# Patient Record
Sex: Male | Born: 1970 | Race: White | Hispanic: No | Marital: Married | Smoking: Former smoker
Health system: Southern US, Community
[De-identification: ages and names within clinical notes are randomized; demographics above are authoritative.]

## PROBLEM LIST (undated history)

## (undated) DIAGNOSIS — E119 Type 2 diabetes mellitus without complications: Secondary | ICD-10-CM

## (undated) HISTORY — PX: KNEE SURGERY: SHX244

---

## 2006-05-20 ENCOUNTER — Ambulatory Visit (HOSPITAL_COMMUNITY): Admission: RE | Admit: 2006-05-20 | Discharge: 2006-05-20 | Payer: Self-pay | Admitting: *Deleted

## 2007-08-09 ENCOUNTER — Emergency Department (HOSPITAL_COMMUNITY): Admission: EM | Admit: 2007-08-09 | Discharge: 2007-08-09 | Payer: Self-pay | Admitting: Emergency Medicine

## 2011-07-13 ENCOUNTER — Emergency Department (HOSPITAL_COMMUNITY)
Admission: EM | Admit: 2011-07-13 | Discharge: 2011-07-13 | Disposition: A | Discharge status: Home / Self Care | Payer: Self-pay | Attending: Emergency Medicine | Admitting: Emergency Medicine

## 2011-07-13 ENCOUNTER — Emergency Department (HOSPITAL_COMMUNITY): Payer: Self-pay

## 2011-07-13 ENCOUNTER — Encounter: Payer: Self-pay | Admitting: *Deleted

## 2011-07-13 DIAGNOSIS — M7580 Other shoulder lesions, unspecified shoulder: Secondary | ICD-10-CM

## 2011-07-13 DIAGNOSIS — M436 Torticollis: Secondary | ICD-10-CM

## 2011-07-13 DIAGNOSIS — M898X9 Other specified disorders of bone, unspecified site: Secondary | ICD-10-CM | POA: Insufficient documentation

## 2011-07-13 DIAGNOSIS — Z87891 Personal history of nicotine dependence: Secondary | ICD-10-CM | POA: Insufficient documentation

## 2011-07-13 MED ORDER — CYCLOBENZAPRINE HCL 10 MG PO TABS: 10.0000 mg | ORAL_TABLET | Freq: Three times a day (TID) | ORAL | Status: AC | PRN

## 2011-07-13 MED ORDER — HYDROMORPHONE HCL 1 MG/ML IJ SOLN
1.0000 mg | Freq: Once | INTRAMUSCULAR | Status: DC
  Filled 2011-07-13: qty 1

## 2011-07-13 MED ORDER — OXYCODONE-ACETAMINOPHEN 5-325 MG PO TABS: 1.0000 | ORAL_TABLET | ORAL | Status: AC | PRN

## 2011-07-13 MED ORDER — ONDANSETRON 8 MG PO TBDP
8.0000 mg | ORAL_TABLET | Freq: Once | ORAL | Status: DC
Start: 2011-07-13 — End: 2011-07-14
  Filled 2011-07-13: qty 1

## 2011-07-13 MED ORDER — CYCLOBENZAPRINE HCL 10 MG PO TABS
10.0000 mg | ORAL_TABLET | Freq: Once | ORAL | Status: AC
  Administered 2011-07-13: 10 mg via ORAL
  Filled 2011-07-13: qty 1

## 2011-07-13 MED ORDER — KETOROLAC TROMETHAMINE 60 MG/2ML IM SOLN
60.0000 mg | Freq: Once | INTRAMUSCULAR | Status: AC
  Administered 2011-07-13: 60 mg via INTRAMUSCULAR
  Filled 2011-07-13: qty 2

## 2011-07-13 NOTE — ED Notes (Signed)
Left shoulder pain that radiates up into neck area, pain increases with movement, denies any injury, states that he woke up with the pain a few days ago

## 2011-07-13 NOTE — ED Provider Notes (Signed)
History     CSN: [id number] Arrival date & time: 07/13/2011  8:30 PM  Chief Complaint  Patient presents with  . Torticollis   HPI Comments: Patient c/o pain to his left neck and shoulder for several days.  States the pain began after sleeping with his "head and neck propped up on pillows" at a hotel.  Describes the pain as sharp and aching from his left neck down his trapezius and into the left shoulder.  He denies chest pain, neck stiffness, visual changes, shortness of breath, numbness or weakness of the arm.    Patient is a 40 y.o. male presenting with neck injury. The history is provided by the patient.  Neck Injury This is a new problem. The current episode started in the past 7 days. The problem occurs constantly. The problem has been gradually worsening. Associated symptoms include myalgias and neck pain. Pertinent negatives include no abdominal pain, arthralgias, chest pain, chills, coughing, diaphoresis, fever, headaches, joint swelling, numbness, sore throat, swollen glands, vomiting or weakness. The symptoms are aggravated by bending (movement). He has tried nothing for the symptoms. The treatment provided no relief.    History reviewed. No pertinent past medical history.  Past Surgical History  Procedure Date  . Knee surgery     14 total surgeries on left knee    History reviewed. No pertinent family history.  History  Substance Use Topics  . Smoking status: Former Games developer  . Smokeless tobacco: Not on file  . Alcohol Use: No      Review of Systems  Constitutional: Negative for fever, chills and diaphoresis.  HENT: Positive for neck pain. Negative for ear pain, sore throat, facial swelling, trouble swallowing and neck stiffness.   Eyes: Negative for pain.  Respiratory: Negative for cough, chest tightness and shortness of breath.   Cardiovascular: Negative for chest pain.  Gastrointestinal: Negative for vomiting and abdominal pain.  Musculoskeletal: Positive for  myalgias. Negative for back pain, joint swelling and arthralgias.  Skin: Negative.   Neurological: Negative for dizziness, facial asymmetry, speech difficulty, weakness, numbness and headaches.  Hematological: Does not bruise/bleed easily.  Psychiatric/Behavioral: Negative for confusion.    Physical Exam  BP 149/86  Pulse 77  Temp(Src) 98.1 F (36.7 C) (Oral)  Resp 20  Ht 5\' 10"  (1.778 m)  Wt 250 lb (113.399 kg)  BMI 35.87 kg/m2  SpO2 97%  Physical Exam  Nursing note and vitals reviewed. Constitutional: He is oriented to person, place, and time. He appears well-developed and well-nourished. No distress.  HENT:  Head: Normocephalic and atraumatic.  Right Ear: External ear normal.  Left Ear: External ear normal.  Mouth/Throat: Oropharynx is clear and moist.  Eyes: EOM are normal. Pupils are equal, round, and reactive to light.  Neck: Normal range of motion. Neck supple. No JVD present. Muscular tenderness present. No spinous process tenderness present. No rigidity. No edema, no erythema and normal range of motion present. No Brudzinski's sign and no Kernig's sign noted. No thyromegaly present.    Cardiovascular: Normal rate, regular rhythm and normal heart sounds.   Pulmonary/Chest: Effort normal and breath sounds normal.  Musculoskeletal: He exhibits tenderness. He exhibits no edema.  Lymphadenopathy:    He has no cervical adenopathy.  Neurological: He is alert and oriented to person, place, and time. No cranial nerve deficit. He exhibits normal muscle tone. Coordination normal.  Skin: Skin is warm and dry.  Psychiatric: He has a normal mood and affect.    ED Course  Procedures  MDM  Patient is ambulatory, feels better.  Pain to left shoulder at the [facility] joint, trapezius and left cervical paraspinal muscles that is reproduced with movement and palpation.  No chest pain, dyspnea, hypoxia, meningeal signs or focal numbness or weakness.  Pain began after sleeping "propped up"  on pillows at a hotel.  Likely torticollis.  I have reviewed the x-ray results and discussed the findings with the pt.  He agrees to f/u with ortho  Dg Shoulder Left  07/13/2011  *RADIOLOGY REPORT*  Clinical Data: Left shoulder pain.  LEFT SHOULDER - 2+ VIEW  Comparison: None.  Findings: No evidence of fracture or dislocation.  Mild degenerative spurring is seen involving the acromioclavicular joint.  No other significant bone abnormality identified.  Soft tissues are unremarkable.  IMPRESSION:  1.  No acute findings. 2.  Mild acromioclavicular degenerative spurring.  Original Report Authenticated By: Danae Orleans, M.D.       Luc Shammas L. [place], [place] 07/15/11 1433

## 2011-08-21 NOTE — ED Provider Notes (Signed)
Medical screening examination/treatment/procedure(s) were performed by non-physician practitioner and as supervising physician I was immediately available for consultation/collaboration.  Doug Sou, MD 08/21/11 [phone]

## 2011-09-23 LAB — POCT CARDIAC MARKERS
Myoglobin, poc: 27.1
Operator id: 217071
Troponin i, poc: <0.05

## 2011-09-23 LAB — BASIC METABOLIC PANEL
BUN: 11
CO2: 30
GFR calc Af Amer: >60
GFR calc non Af Amer: >60
Sodium: 137

## 2011-09-23 LAB — DIFFERENTIAL
Basophils Absolute: 0.1
Monocytes Absolute: 0.7
Neutro Abs: 7
Neutrophils Relative %: 69

## 2011-09-23 LAB — CBC: WBC: 10.2

## 2015-03-18 ENCOUNTER — Emergency Department (HOSPITAL_COMMUNITY)
Admission: EM | Admit: 2015-03-18 | Discharge: 2015-03-18 | Disposition: A | Discharge status: Home / Self Care | Payer: Medicaid Other | Attending: Emergency Medicine | Admitting: Emergency Medicine

## 2015-03-18 ENCOUNTER — Emergency Department (HOSPITAL_COMMUNITY): Payer: Medicaid Other

## 2015-03-18 ENCOUNTER — Encounter (HOSPITAL_COMMUNITY): Payer: Self-pay | Admitting: Emergency Medicine

## 2015-03-18 DIAGNOSIS — M79672 Pain in left foot: Secondary | ICD-10-CM

## 2015-03-18 DIAGNOSIS — G8929 Other chronic pain: Secondary | ICD-10-CM | POA: Diagnosis not present

## 2015-03-18 DIAGNOSIS — Z87891 Personal history of nicotine dependence: Secondary | ICD-10-CM | POA: Insufficient documentation

## 2015-03-18 DIAGNOSIS — Z791 Long term (current) use of non-steroidal anti-inflammatories (NSAID): Secondary | ICD-10-CM | POA: Insufficient documentation

## 2015-03-18 DIAGNOSIS — E119 Type 2 diabetes mellitus without complications: Secondary | ICD-10-CM | POA: Insufficient documentation

## 2015-03-18 HISTORY — DX: Type 2 diabetes mellitus without complications: E11.9

## 2015-03-18 MED ORDER — DICLOFENAC SODIUM 75 MG PO TBEC: 75.0000 mg | DELAYED_RELEASE_TABLET | Freq: Two times a day (BID) | ORAL | Status: DC
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
## 2015-03-18 NOTE — ED Notes (Signed)
Pt c/o increased left foot pain over last month and he denies any injury.
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
## 2015-03-18 NOTE — ED Notes (Signed)
Pt alert & oriented x4, stable gait. Patient given discharge instructions, paperwork & prescription(s). Patient  instructed to stop at the registration desk to finish any additional paperwork. Patient verbalized understanding. Pt left department w/ no further questions. 

## 2015-10-12 ENCOUNTER — Emergency Department (HOSPITAL_COMMUNITY)
Admission: EM | Admit: 2015-10-12 | Discharge: 2015-10-12 | Disposition: A | Discharge status: Home / Self Care | Payer: Medicaid Other | Attending: Emergency Medicine | Admitting: Emergency Medicine

## 2015-10-12 ENCOUNTER — Encounter (HOSPITAL_COMMUNITY): Payer: Self-pay | Admitting: Emergency Medicine

## 2015-10-12 ENCOUNTER — Emergency Department (HOSPITAL_COMMUNITY): Payer: Medicaid Other

## 2015-10-12 DIAGNOSIS — M25561 Pain in right knee: Secondary | ICD-10-CM | POA: Diagnosis present

## 2015-10-12 DIAGNOSIS — M222X1 Patellofemoral disorders, right knee: Secondary | ICD-10-CM | POA: Insufficient documentation

## 2015-10-12 DIAGNOSIS — Z87891 Personal history of nicotine dependence: Secondary | ICD-10-CM | POA: Diagnosis not present

## 2015-10-12 DIAGNOSIS — M25551 Pain in right hip: Secondary | ICD-10-CM | POA: Diagnosis not present

## 2015-10-12 DIAGNOSIS — Z87890 Personal history of sex reassignment: Secondary | ICD-10-CM | POA: Diagnosis not present

## 2015-10-12 DIAGNOSIS — M79604 Pain in right leg: Secondary | ICD-10-CM

## 2015-10-12 DIAGNOSIS — E119 Type 2 diabetes mellitus without complications: Secondary | ICD-10-CM | POA: Diagnosis not present

## 2015-10-12 DIAGNOSIS — M1711 Unilateral primary osteoarthritis, right knee: Secondary | ICD-10-CM

## 2015-10-12 NOTE — ED Provider Notes (Signed)
CSN: [id number]     Arrival date & time 10/12/15  1106 History  By signing my name below, I, Gwenyth Ober, attest that this documentation has been prepared under the direction and in the presence of Ivery Quale, PA-C.  Electronically Signed: Gwenyth Ober, ED Scribe. 10/12/2015. 1:05 PM.   Chief Complaint  Patient presents with  . Leg Pain    right   Patient is a 44 y.o. male presenting with leg pain. The history is provided by the patient. No language interpreter was used.  Leg Pain Location:  Knee and leg Time since incident:  10 days Injury: no   Leg location:  R lower leg Knee location:  R knee Pain details:    Quality:  Aching   Radiates to:  Does not radiate   Severity:  Moderate   Onset quality:  Gradual   Duration:  10 days   Timing:  Intermittent Chronicity:  New Dislocation: no   Foreign body present:  No foreign bodies Exacerbated by: lying down. Associated symptoms: no fever     HPI Comments: Ryan Schwartz is a 44 y.o. 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 He states baseline right hip pain which is unchanged today. Pt denies any injuries or falls. Pt works as a Emergency planning/management officer of homes. He denies fever and chills.  Past Medical History  Diagnosis Date  . Diabetes mellitus without complication [facility])    Past Surgical History  Procedure Laterality Date  . Knee surgery      14 total surgeries on left knee   History reviewed. No pertinent family history. Social History  Substance Use Topics  . Smoking status: Former Games developer  . Smokeless tobacco: None  . Alcohol Use: No   Review of Systems  Constitutional: Negative for fever and chills.  Musculoskeletal: Positive for arthralgias.  All other systems reviewed and are negative.  Allergies  Review of patient's allergies indicates no known  allergies.  Home Medications   Prior to Admission medications   Medication Sig Start Date End Date Taking? Authorizing Provider  ibuprofen (ADVIL,MOTRIN) 200 MG tablet Take 400 mg by mouth every 6 (six) hours as needed. For pain/headaches    Yes Historical Provider, MD   BP 127/75 mmHg  Pulse 68  Temp(Src) 98.2 F (36.8 C) (Oral)  Resp 18  Ht  (1.778 m)  Wt 240 lb (108.863 kg)  BMI 34.44 kg/m2  SpO2 99% Physical Exam  Constitutional: He is oriented to person, place, and time. He appears well-developed and well-nourished. No distress.  HENT:  Head: Normocephalic and atraumatic.  Eyes: Conjunctivae and EOM are normal.  Neck: Neck supple. No tracheal deviation present.  Cardiovascular: Normal rate.   Pulmonary/Chest: Effort normal. No respiratory distress.  Musculoskeletal:  No deformity of the quadriceps area Patella is midline No deformity of the anterior tibial tuberosity Soreness of the upper anterior tibial area No calf swelling and negative Homan's Achilles tendon intact  Neurological: He is alert and oriented to person, place, and time.  No gross motor or sensory deficits appreciated   Skin: Skin is warm and dry.  Psychiatric: He has a normal mood and affect. His behavior is normal.  Nursing note and vitals reviewed.   ED Course  Procedures  DIAGNOSTIC STUDIES: Oxygen Saturation is 99% on RA, normal by my interpretation.  COORDINATION OF CARE: 1:05 PM Discussed x-ray results with evidence for arthritic changes. R/o nerve/circulatory problems and DVT based on exam findings. Discussed treatment plan with pt which includes NSAIDs and follow-up with orthopedist. Knee immobilizer was offered, but pt states he has one at home. Pt agreed to plan.  Labs Review Labs Reviewed - No data to display  Imaging Review Dg Knee Complete 4 Views Right  10/12/2015  CLINICAL DATA:  Posterior right knee pain radiating into calf. EXAM: RIGHT KNEE - COMPLETE 4+ VIEW  COMPARISON:  None. FINDINGS: There are mild proliferative changes involving the tibial spines without significant medial or lateral joint space narrowing. The patellofemoral joint shows moderate proliferative changes. No fracture, dislocation or joint effusion identified. No bony lesions are seen. Soft tissues are unremarkable. IMPRESSION: Moderate patellofemoral proliferative changes.  No acute findings. Electronically Signed   By: Irish LackGlenn  Yamagata M.D.   On: 10/12/2015 12:51   Dg Hip Unilat With Pelvis 2-3 Views Right  10/12/2015  CLINICAL DATA:  Posterior right hip pain. EXAM: DG HIP (WITH OR WITHOUT PELVIS) 2-3V RIGHT COMPARISON:  None. FINDINGS: No fracture or dislocation identified. Minimal proliferative changes are seen involving the acetabulum without significant joint space loss. No bony lesions or destruction. Soft tissues are unremarkable. The visualized bony pelvis is intact and shows no abnormalities. IMPRESSION: No acute findings. Minimal proliferative changes involving the right acetabulum. Electronically Signed   By: Irish LackGlenn  Yamagata M.D.   On: 10/12/2015 12:50   I have personally reviewed and evaluated these images as part of my medical decision-making.   EKG Interpretation None      MDM  Vital signs are well within normal limits. The x-ray shows degenerative changes with proliferative changes in both the right hip and right knee. The patient is referred to Dr. Victorino DikeHewitt for orthopedic evaluation and management of this issue. Knee immobilizer was offered, patient states he has one at home. No evidence of septic joint, or dislocation, or deep vein thrombus.    Final diagnoses:  None    **I have reviewed nursing notes, vital signs, and all appropriate lab and imaging results for this patient.*  **I personally performed the services described in this documentation, which was scribed in my presence. The recorded information has been reviewed and is accurate.Ivery Quale*   Ryan Laine,  PA-C 10/12/15 1342  Samuel JesterKathleen McManus, DO 10/14/15 [phone]

## 2015-10-12 NOTE — ED Notes (Signed)
Pain to right leg for last 10 days, rates pain 8/10.  Denies injury.

## 2015-10-12 NOTE — Discharge Instructions (Signed)
The x-ray of your hip show some arthritis present. The x-ray of your knee show some patellofemoral arthritis and erosion. It is believed that this may be contributing to the discomfort you're having behind your knee and extending anterior calf. Please see Dr. Victorino DikeHewitt, or the orthopedic specialist of your choice for orthopedic evaluation of these findings. Please use your knee immobilizer when up and about.

## 2016-05-02 ENCOUNTER — Emergency Department (HOSPITAL_COMMUNITY)
Admission: EM | Admit: 2016-05-02 | Discharge: 2016-05-02 | Disposition: A | Discharge status: Home / Self Care | Payer: Medicaid Other | Attending: Emergency Medicine | Admitting: Emergency Medicine

## 2016-05-02 ENCOUNTER — Encounter (HOSPITAL_COMMUNITY): Payer: Self-pay | Admitting: Emergency Medicine

## 2016-05-02 DIAGNOSIS — E119 Type 2 diabetes mellitus without complications: Secondary | ICD-10-CM | POA: Diagnosis not present

## 2016-05-02 DIAGNOSIS — L237 Allergic contact dermatitis due to plants, except food: Secondary | ICD-10-CM | POA: Insufficient documentation

## 2016-05-02 DIAGNOSIS — Z87891 Personal history of nicotine dependence: Secondary | ICD-10-CM | POA: Insufficient documentation

## 2016-05-02 DIAGNOSIS — Z791 Long term (current) use of non-steroidal anti-inflammatories (NSAID): Secondary | ICD-10-CM | POA: Diagnosis not present

## 2016-05-02 MED ORDER — DEXAMETHASONE SODIUM PHOSPHATE 10 MG/ML IJ SOLN
10.0000 mg | Freq: Once | INTRAMUSCULAR | Status: AC
  Administered 2016-05-02: 10 mg via INTRAMUSCULAR

## 2016-05-02 MED ORDER — DEXAMETHASONE SODIUM PHOSPHATE 4 MG/ML IJ SOLN: 10.0000 mg | Freq: Once | INTRAMUSCULAR | Status: DC

## 2016-05-02 MED ORDER — DEXAMETHASONE SODIUM PHOSPHATE 10 MG/ML IJ SOLN
INTRAMUSCULAR | Status: AC
  Filled 2016-05-02: qty 1

## 2016-05-02 MED ORDER — PREDNISONE 10 MG PO TABS: ORAL_TABLET | ORAL | Status: AC
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
## 2016-05-02 NOTE — Discharge Instructions (Signed)
Poison Oak Poison oak is an inflammation of the skin (contact dermatitis). It is caused by contact with the allergens on the leaves of the oak (toxicodendron) plants. Depending on your sensitivity, the rash may consist simply of redness and itching, or it may also progress to blisters which may break open (rupture). These must be well cared for to prevent secondary germ (bacterial) infection as these infections can lead to scarring. The eyes may also get puffy. The puffiness is worst in the morning and gets better as the day progresses. Healing is best accomplished by keeping any open areas dry, clean, covered with a bandage, and covered with an antibacterial ointment if needed. Without secondary infection, this dermatitis usually heals without scarring within 2 to 3 weeks without treatment. HOME CARE INSTRUCTIONS When you have been exposed to poison oak, it is very important to thoroughly wash with soap and water as soon as the exposure has been discovered. You have about one half hour to remove the plant resin before it will cause the rash. This cleaning will quickly destroy the oil or antigen on the skin (the antigen is what causes the rash). Wash aggressively under the fingernails as any plant resin still there will continue to spread the rash. Do not rub skin vigorously when washing affected area. Poison oak cannot spread if no oil from the plant remains on your body. Rash that has progressed to weeping sores (lesions) will not spread the rash unless you have not washed thoroughly. It is also important to clean any clothes you have been wearing as they may carry active allergens which will spread the rash, even several days later. Avoidance of the plant in the future is the best measure. Poison oak plants can be recognized by the number of leaves. Generally, poison oak has three leaves with flowering branches on a single stem. Diphenhydramine may be purchased over the counter and used as needed for  itching. Do not drive with this medication if it makes you drowsy. Ask your caregiver about medication for children. SEEK IMMEDIATE MEDICAL CARE IF:   Open areas of the rash develop.  You notice redness extending beyond the area of the rash.  There is a pus like discharge.  There is increased pain.  Other signs of infection develop (such as fever).   This information is not intended to replace advice given to you by your health care provider. Make sure you discuss any questions you have with your health care provider.   Document Released: 06/04/2003 Document Revised: 02/20/2012 Document Reviewed: 05/06/2015 Elsevier Interactive Patient Education 2016 [employer]  

## 2016-05-02 NOTE — ED Provider Notes (Signed)
CSN: [id number]     Arrival date & time 05/02/16  1258 History  By signing my name below, I, Ryan Schwartz, attest that this documentation has been prepared under the direction and in the presence of Treatment Team:  Attending Provider: Rolland Porter, MD Physician Assistant: Pauline Aus, PA-C.  Electronically Signed: Arvilla Market, Medical Scribe. 05/02/2016. 3:08 PM.   Chief Complaint  Patient presents with  . Poison Oak   The history is provided by the patient. No language interpreter was used.   HPI Comments: Ryan Schwartz is a 45 y.o. 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 Pt states that he has had a severe reaction to poison oak, causing burning and swollen eyes in the past. Pt was burning some poison oak when he was exposed on both lower legs, arms, and possibly around his eyes. He states that he is having associated symptoms of itchiness. He states that he has not tried medication with for relief of his symptoms. He denies fevers, chills, trouble swallowing, visual changes and trouble breathing.   Past Medical History  Diagnosis Date  . Diabetes mellitus without complication [facility])    Past Surgical History  Procedure Laterality Date  . Knee surgery      14 total surgeries on left knee   History reviewed. No pertinent family history. Social History  Substance Use Topics  . Smoking status: Former Games developer  . Smokeless tobacco: None  . Alcohol Use: No    Review of Systems  Constitutional: Negative for fever and chills.  HENT: Negative for sore throat and trouble swallowing.   Respiratory: Negative for chest tightness and shortness of breath.   Musculoskeletal: Negative for myalgias and arthralgias.  Skin: Positive for rash.  Neurological: Negative for dizziness, syncope and headaches.  All other systems reviewed and are negative.   Allergies  Review of patient's allergies indicates  no known allergies.  Home Medications   Prior to Admission medications   Medication Sig Start Date End Date Taking? Authorizing Provider  ibuprofen (ADVIL,MOTRIN) 200 MG tablet Take 400 mg by mouth every 6 (six) hours as needed. For pain/headaches     Historical Provider, MD   BP 141/81 mmHg  Pulse 85  Temp(Src) 98.5 F (36.9 C) (Oral)  Resp 16  Ht  (1.778 m)  Wt 228 lb (103.42 kg)  BMI 32.71 kg/m2  SpO2 99% Physical Exam  Constitutional: He appears well-developed and well-nourished. No distress.  HENT:  Head: Normocephalic and atraumatic.  Mouth/Throat: Oropharynx is clear and moist.  Eyes: Conjunctivae and EOM are normal. Pupils are equal, round, and reactive to light. Right eye exhibits no chemosis and no discharge. Left eye exhibits no chemosis and no discharge.  Neck: Normal range of motion. Neck supple.  Cardiovascular: Normal rate and regular rhythm.  Exam reveals no gallop and no friction rub.   No murmur heard. Pulmonary/Chest: Effort normal and breath sounds normal. No respiratory distress. He has no wheezes. He has no rales.  Musculoskeletal: Normal range of motion.  Lymphadenopathy:    He has no cervical adenopathy.  Neurological: He is alert.  Skin: Skin is warm and dry.  Scattered erythematous and vesicular lesions to bilateral lower extremities, right arm, and right periorbital region No edema  Psychiatric: He has a normal mood and affect. His behavior is normal.  Nursing note and vitals reviewed.   ED Course  Procedures  DIAGNOSTIC STUDIES:  Oxygen Saturation is 99% on RA, NL by my interpretation.    COORDINATION OF CARE: 2:57 PM Discussed treatment plan with pt at bedside and pt agreed to plan.   MDM   Final diagnoses:  Poison oak    Pt well appearing.  No distress.  Scattered rash appears c/w plant dermatitis.  IM decadron given here, prednisone taper prescribed and pt agrees to continue OTC benadryl.    I personally performed the services  described in this documentation, which was scribed in my presence. The recorded information has been reviewed and is accurate.   Pauline Ausammy Ballard Budney, PA-C 05/04/16 1603  Rolland PorterMark James, MD 05/16/16 Burna Mortimer0010

## 2016-05-02 NOTE — ED Notes (Signed)
Pt alert & oriented x4, stable gait. Patient given discharge instructions, paperwork & prescription(s). Patient  instructed to stop at the registration desk to finish any additional paperwork. Patient verbalized understanding. Pt left department w/ no further questions. 

## 2016-05-02 NOTE — ED Notes (Signed)
Patient complaining of poison oak to bilateral legs, arms, and face at triage.

## 2017-03-30 IMAGING — DX DG HIP (WITH OR WITHOUT PELVIS) 2-3V*R*
3 series · 3 of 3 positions shown · non-contrast
Comparison: None.

CLINICAL DATA: Posterior right hip pain.

EXAM:
DG HIP (WITH OR WITHOUT PELVIS) 2-3V RIGHT

[pelvis ap]
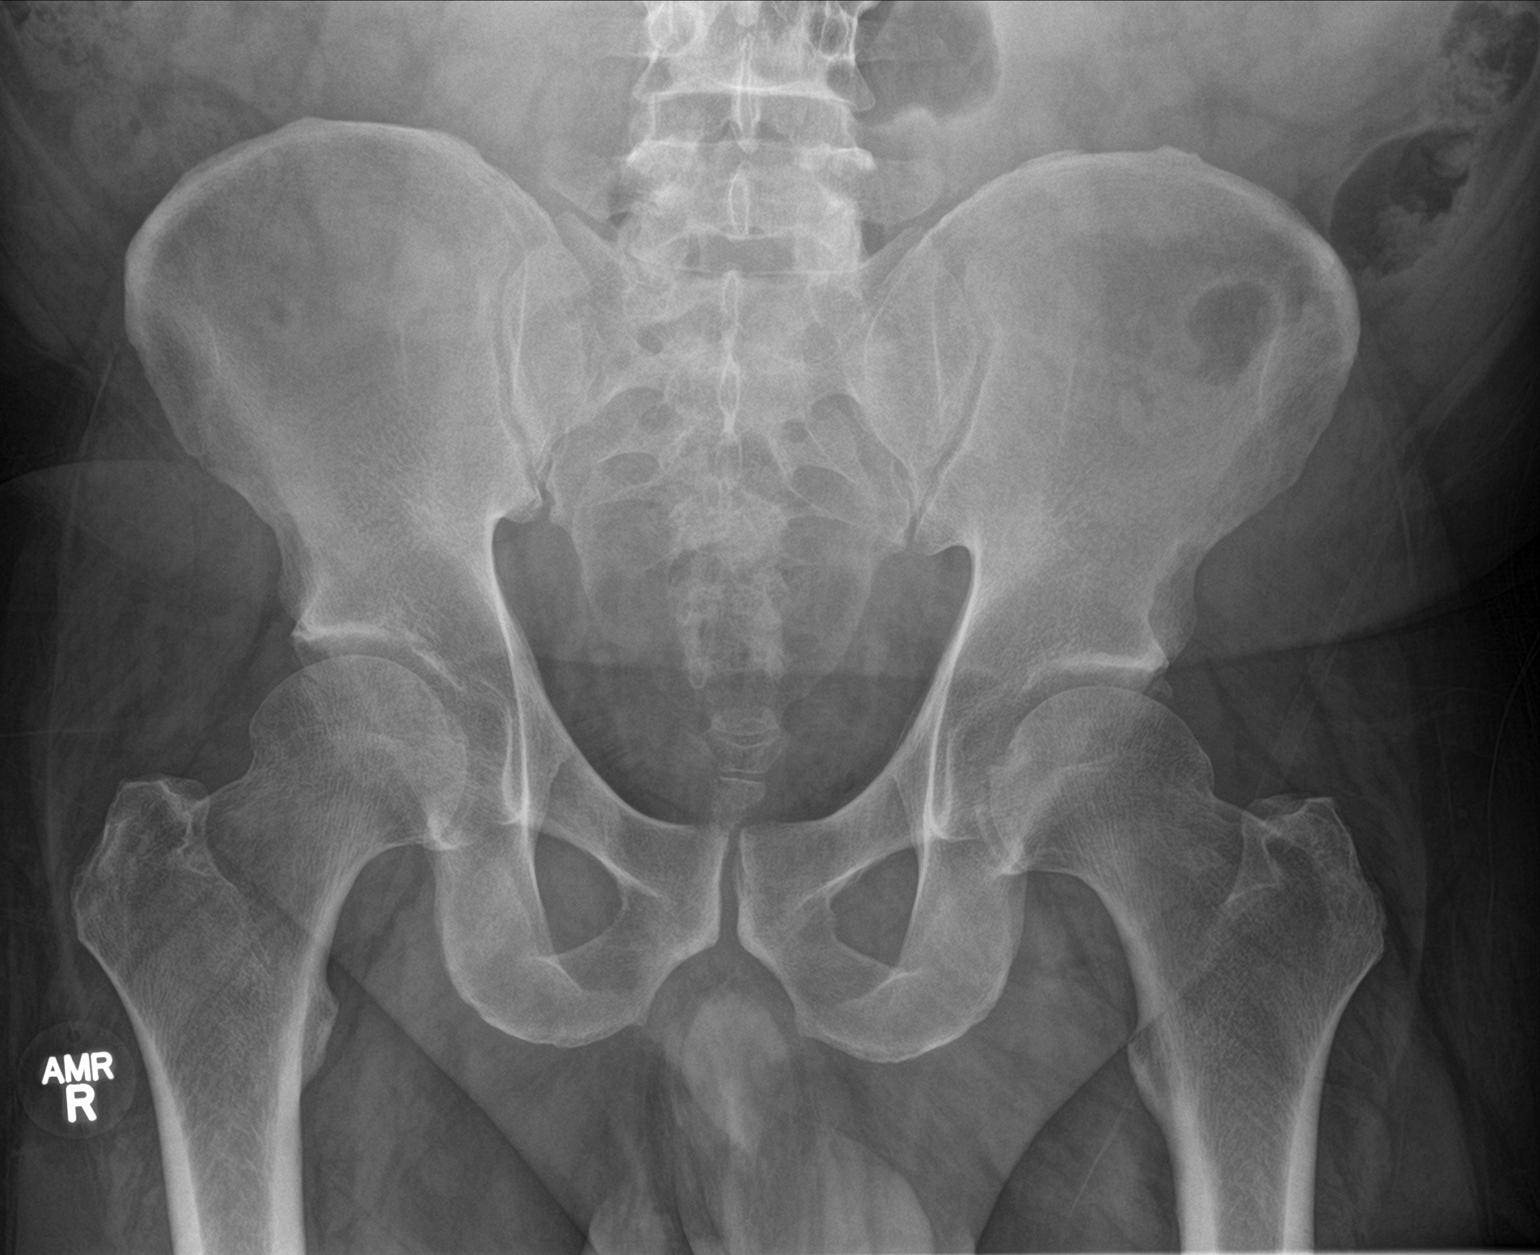

[hip ap]
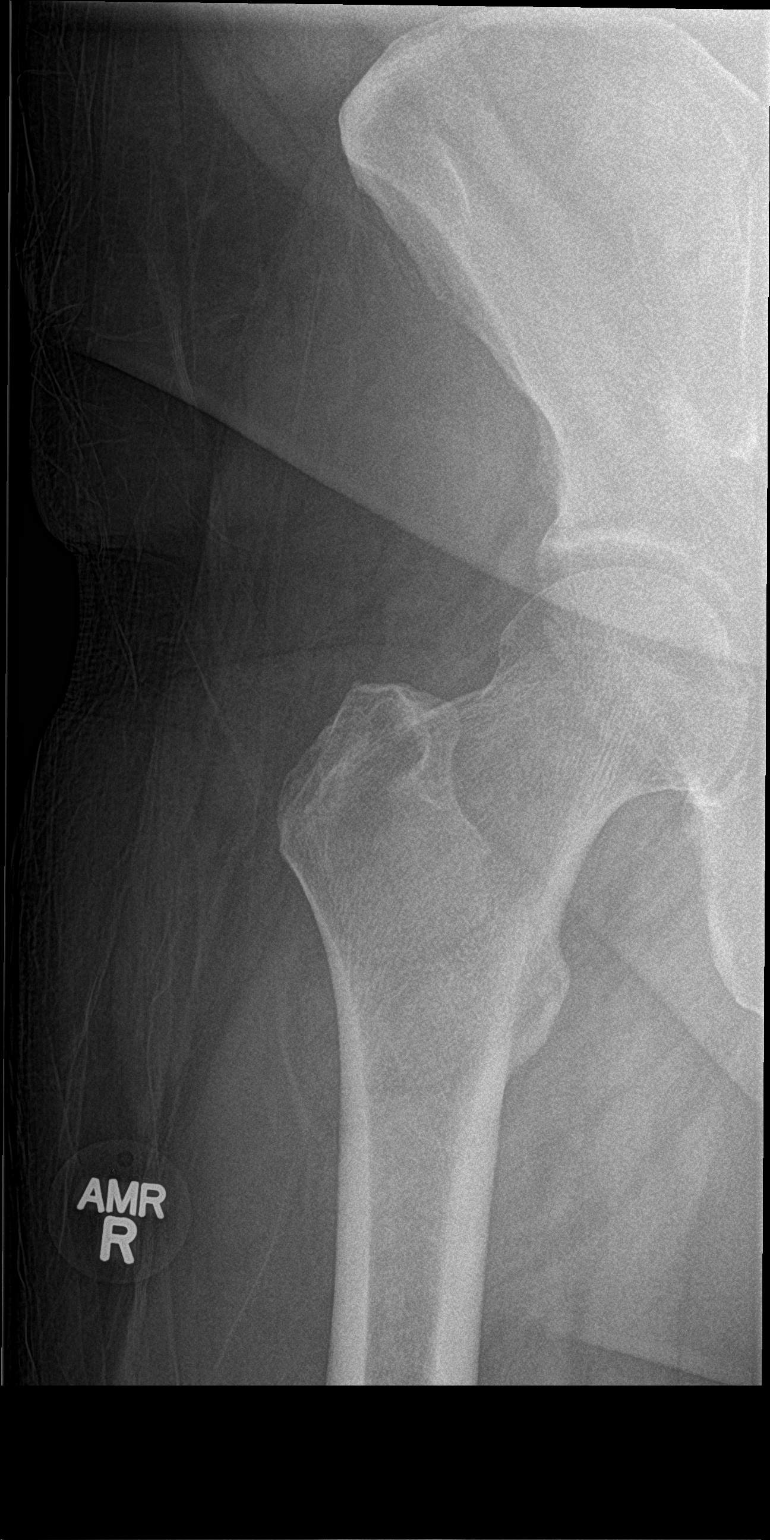

[hip frog leg]
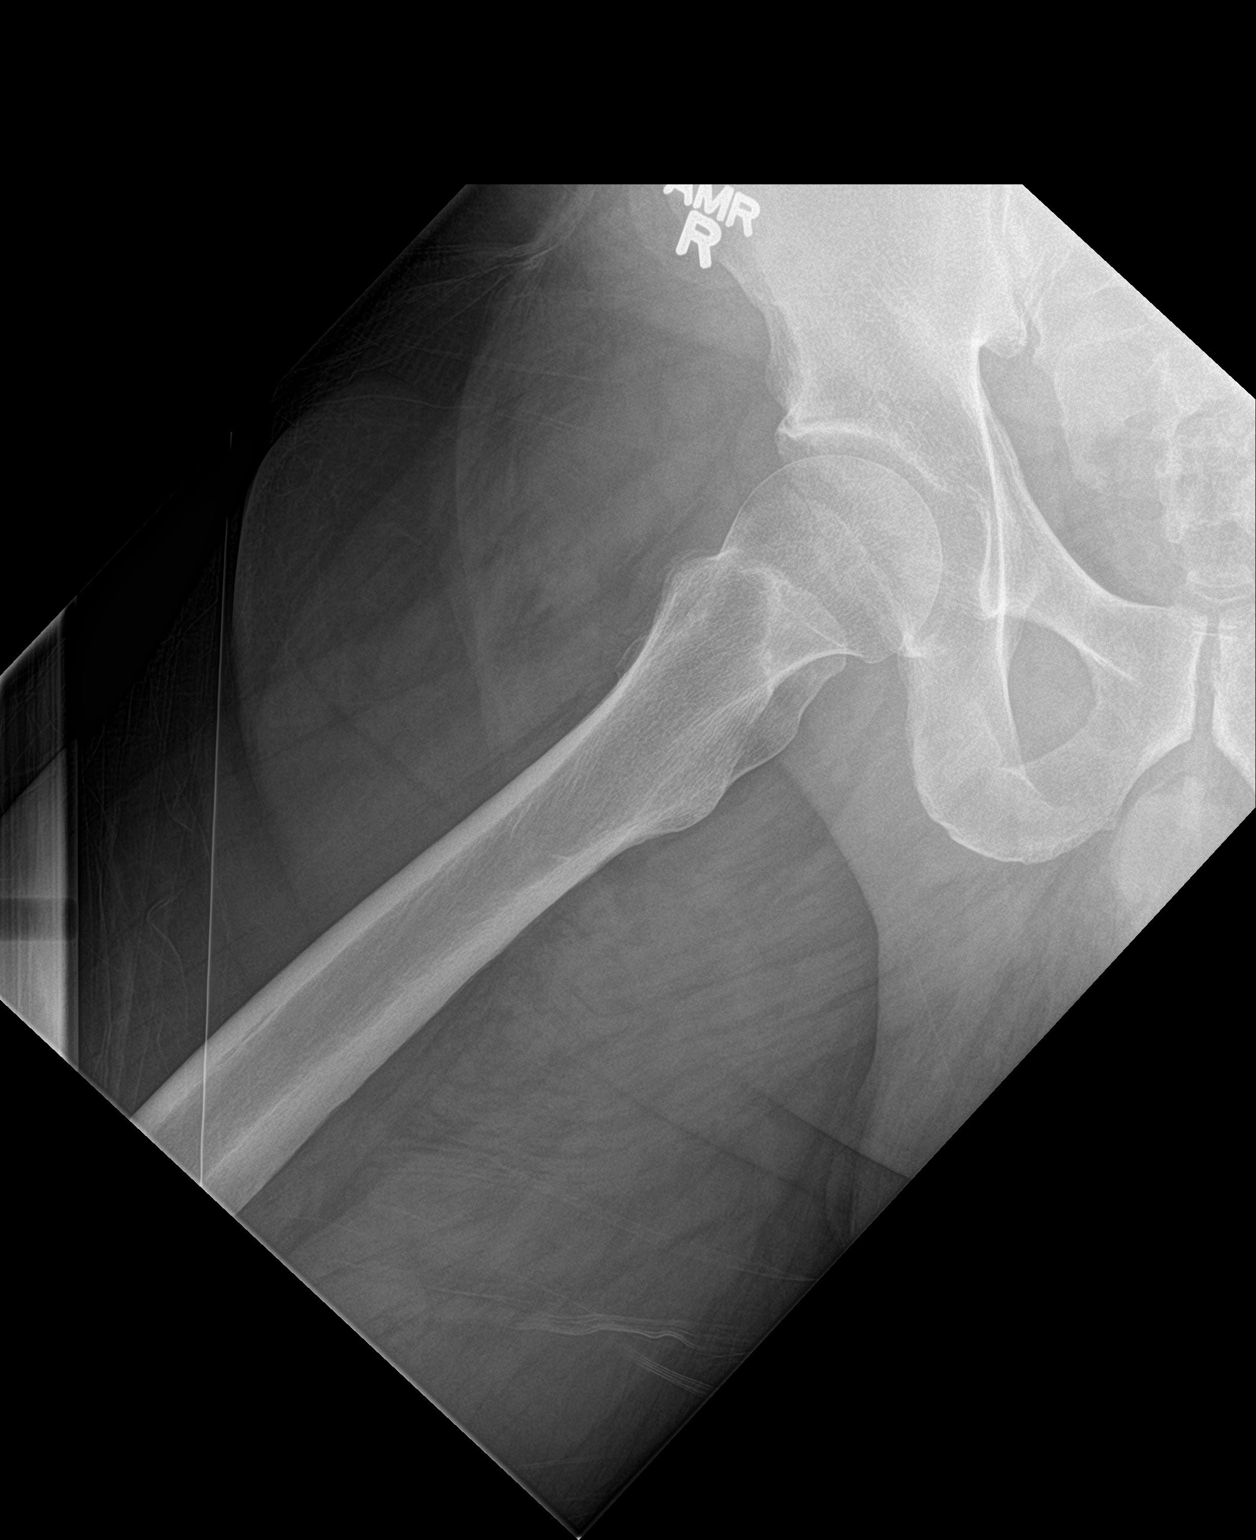

[3 of 3 positions shown; findings below may reference images not displayed]

FINDINGS: No fracture or dislocation identified. Minimal proliferative changes
are seen involving the acetabulum without significant joint space
loss. No bony lesions or destruction. Soft tissues are unremarkable.
The visualized bony pelvis is intact and shows no abnormalities.
IMPRESSION: No acute findings. Minimal proliferative changes involving the right
acetabulum.

## 2018-03-23 ENCOUNTER — Other Ambulatory Visit: Payer: Self-pay

## 2018-03-23 ENCOUNTER — Encounter (HOSPITAL_COMMUNITY): Payer: Self-pay | Admitting: Emergency Medicine

## 2018-03-23 ENCOUNTER — Emergency Department (HOSPITAL_COMMUNITY)
Admission: EM | Admit: 2018-03-23 | Discharge: 2018-03-23 | Disposition: A | Discharge status: Home / Self Care | Payer: Self-pay | Attending: Emergency Medicine | Admitting: Emergency Medicine

## 2018-03-23 DIAGNOSIS — E119 Type 2 diabetes mellitus without complications: Secondary | ICD-10-CM | POA: Insufficient documentation

## 2018-03-23 DIAGNOSIS — Z87891 Personal history of nicotine dependence: Secondary | ICD-10-CM | POA: Insufficient documentation

## 2018-03-23 DIAGNOSIS — J111 Influenza due to unidentified influenza virus with other respiratory manifestations: Secondary | ICD-10-CM | POA: Insufficient documentation

## 2018-03-23 DIAGNOSIS — R739 Hyperglycemia, unspecified: Secondary | ICD-10-CM | POA: Insufficient documentation

## 2018-03-23 LAB — CBG MONITORING, ED: Glucose-Capillary: 310 mg/dL — ABNORMAL HIGH (ref 65–99)

## 2018-03-23 MED ORDER — IBUPROFEN 600 MG PO TABS: 600.0000 mg | ORAL_TABLET | Freq: Four times a day (QID) | ORAL | 0 refills | Status: AC | PRN

## 2018-03-23 MED ORDER — KETOROLAC TROMETHAMINE 30 MG/ML IJ SOLN
30.0000 mg | Freq: Once | INTRAMUSCULAR | Status: AC
  Administered 2018-03-23: 30 mg via INTRAMUSCULAR
  Filled 2018-03-23: qty 1

## 2018-03-23 MED ORDER — OSELTAMIVIR PHOSPHATE 75 MG PO CAPS: 75.0000 mg | ORAL_CAPSULE | Freq: Two times a day (BID) | ORAL | 0 refills | Status: AC

## 2018-03-23 NOTE — ED Provider Notes (Signed)
[facility] Provider Note   CSN: [id number] Arrival date & time: 03/23/18  1802     History   Chief Complaint Chief Complaint  Patient presents with  . Generalized Body Aches    HPI Ryan Schwartz is a 47 y.o. male.  Pt presents to the ED today with fever, chills, body aches, cough.  His whole family has tested positive for the flu.  Sx started yesterday.  He took otc tylenol cold med this morning.  His mouth feels very dry.     Past Medical History:  Diagnosis Date  . Diabetes mellitus without complication (HCC)     There are no active problems to display for this patient.   Past Surgical History:  Procedure Laterality Date  . KNEE SURGERY     14 total surgeries on left knee        Home Medications    Prior to Admission medications   Medication Sig Start Date End Date Taking? Authorizing Provider  ibuprofen (ADVIL,MOTRIN) 600 MG tablet Take 1 tablet (600 mg total) by mouth every 6 (six) hours as needed. 03/23/18   Jacalyn LefevreHaviland, Juanangel Soderholm, MD  oseltamivir (TAMIFLU) 75 MG capsule Take 1 capsule (75 mg total) by mouth every 12 (twelve) hours. 03/23/18   Jacalyn LefevreHaviland, Peaches Vanoverbeke, MD  predniSONE (DELTASONE) 10 MG tablet Take 6 tablets day one, 5 tablets day two, 4 tablets day three, 3 tablets day four, 2 tablets day five, then 1 tablet day six 05/02/16   Pauline Ausriplett, Tammy, PA-C    Family History History reviewed. No pertinent family history.  Social History Social History   Tobacco Use  . Smoking status: Former Games developermoker  . Smokeless tobacco: Never Used  Substance Use Topics  . Alcohol use: No  . Drug use: No     Allergies   Patient has no known allergies.   Review of Systems Review of Systems  Constitutional: Positive for chills and fatigue.  Respiratory: Positive for cough.   Musculoskeletal: Positive for arthralgias and myalgias.  All other systems reviewed and are negative.    Physical Exam Updated Vital Signs BP 131/83 (BP Location:  Right Arm)   Pulse 83   Temp 98.8 F (37.1 C) (Oral)   Resp 18   Ht 5\' 10"  (1.778 m)   Wt 104.3 kg (230 lb)   SpO2 100%   BMI 33.00 kg/m   Physical Exam  Constitutional: He is oriented to person, place, and time. He appears well-developed and well-nourished.  HENT:  Head: Normocephalic and atraumatic.  Right Ear: External ear normal.  Left Ear: External ear normal.  Nose: Nose normal.  Mouth/Throat: Oropharynx is clear and moist.  Eyes: Pupils are equal, round, and reactive to light. Conjunctivae and EOM are normal.  Neck: Normal range of motion. Neck supple.  Cardiovascular: Normal rate, regular rhythm, normal heart sounds and intact distal pulses.  Pulmonary/Chest: Effort normal and breath sounds normal.  Abdominal: Soft. Bowel sounds are normal.  Musculoskeletal: Normal range of motion.  Neurological: He is alert and oriented to person, place, and time.  Skin: Skin is warm. Capillary refill takes less than 2 seconds.  Psychiatric: He has a normal mood and affect. His behavior is normal. Judgment and thought content normal.  Nursing note and vitals reviewed.    ED Treatments / Results  Labs (all labs ordered are listed, but only abnormal results are displayed) Labs Reviewed  CBG MONITORING, ED - Abnormal; Notable for the following components:  Result Value   Glucose-Capillary 310 (*)    All other components within normal limits    EKG None  Radiology No results found.  Procedures Procedures (including critical care time)  Medications Ordered in ED Medications  ketorolac (TORADOL) 30 MG/ML injection 30 mg (has no administration in time range)     Initial Impression / Assessment and Plan / ED Course  I have reviewed the triage vital signs and the nursing notes.  Pertinent labs & imaging results that were available during my care of the patient were reviewed by me and considered in my medical decision making (see chart for details).    As all family  members have the flu, pt will be treated with tamiflu.  He will be given toradol im prior to d/c.  Pt's blood sugar is 310.  He has a hx of DM, but it's been normal without meds for the past few months.  He does have metformin at home as well as a glucose monitor.  He is instructed to start taking it again until he can see his doctor.  He said he has an appt in May.  He is encouraged to drink lots of fluids.  Return if worse.  Final Clinical Impressions(s) / ED Diagnoses   Final diagnoses:  Influenza  Hyperglycemia    ED Discharge Orders        Ordered    ibuprofen (ADVIL,MOTRIN) 600 MG tablet  Every 6 hours PRN     03/23/18 1848    oseltamivir (TAMIFLU) 75 MG capsule  Every 12 hours     03/23/18 Benay Spice, MD 03/23/18 1859

## 2018-03-23 NOTE — ED Triage Notes (Signed)
Pt c/o of generalized body aches, fever, headache, cough, x 2 days.

## 2018-03-23 NOTE — ED Notes (Signed)
Dr.Haviland notified of CBG.

## 2018-03-23 NOTE — Discharge Instructions (Addendum)
Take your metformin that you have at home.  Check your blood sugar daily and create a log for your doctor.
